# Patient Record
Sex: Female | Born: 2000 | Race: White | Hispanic: No | Marital: Single | Smoking: Never smoker
Health system: Southern US, Community
[De-identification: ages and names within clinical notes are randomized; demographics above are authoritative.]

## PROBLEM LIST (undated history)

## (undated) DIAGNOSIS — F32A Depression, unspecified: Secondary | ICD-10-CM

## (undated) DIAGNOSIS — G43909 Migraine, unspecified, not intractable, without status migrainosus: Secondary | ICD-10-CM

## (undated) HISTORY — PX: HERNIA REPAIR: SHX51

## (undated) HISTORY — DX: Migraine, unspecified, not intractable, without status migrainosus: G43.909

## (undated) HISTORY — DX: Depression, unspecified: F32.A

## (undated) HISTORY — PX: TOOTH EXTRACTION: SUR596

---
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
## 2020-04-30 ENCOUNTER — Emergency Department (HOSPITAL_COMMUNITY)
Admission: EM | Admit: 2020-04-30 | Discharge: 2020-04-30 | Disposition: A | Discharge status: Home / Self Care | Payer: Medicaid Other | Attending: Emergency Medicine | Admitting: Emergency Medicine

## 2020-04-30 ENCOUNTER — Encounter (HOSPITAL_COMMUNITY): Payer: Self-pay | Admitting: *Deleted

## 2020-04-30 ENCOUNTER — Other Ambulatory Visit: Payer: Self-pay

## 2020-04-30 DIAGNOSIS — M545 Low back pain, unspecified: Secondary | ICD-10-CM | POA: Diagnosis not present

## 2020-04-30 LAB — URINALYSIS, ROUTINE W REFLEX MICROSCOPIC
Bilirubin Urine: NEGATIVE
Glucose, UA: NEGATIVE mg/dL
Hgb urine dipstick: NEGATIVE
Ketones, ur: NEGATIVE mg/dL
Leukocytes,Ua: NEGATIVE
Nitrite: NEGATIVE
Protein, ur: NEGATIVE mg/dL
Specific Gravity, Urine: 1.024 (ref 1.005–1.030)
pH: 7 (ref 5.0–8.0)

## 2020-04-30 LAB — PREGNANCY, URINE: Preg Test, Ur: NEGATIVE

## 2020-04-30 MED ORDER — METHOCARBAMOL 500 MG PO TABS
500.0000 mg | ORAL_TABLET | Freq: Once | ORAL | Status: AC
  Administered 2020-04-30: 500 mg via ORAL
  Filled 2020-04-30: qty 1

## 2020-04-30 MED ORDER — LIDOCAINE 5 % EX PTCH
1.0000 | MEDICATED_PATCH | Freq: Once | CUTANEOUS | Status: DC
  Administered 2020-04-30: 1 via TRANSDERMAL
  Filled 2020-04-30: qty 1

## 2020-04-30 MED ORDER — KETOROLAC TROMETHAMINE 30 MG/ML IJ SOLN
30.0000 mg | Freq: Once | INTRAMUSCULAR | Status: AC
  Administered 2020-04-30: 30 mg via INTRAMUSCULAR
  Filled 2020-04-30: qty 1

## 2020-04-30 NOTE — ED Triage Notes (Signed)
States she has had problems getting her urine out for the past 6 months.

## 2020-04-30 NOTE — Discharge Instructions (Signed)
You were seen here today for Back Pain: Low back pain is discomfort in the lower back that may be due to injuries to muscles and ligaments around the spine. Occasionally, it may be caused by a problem to a part of the spine called a disc. Your back pain should be treated with medicines listed below as well as back exercises and this back pain should get better over the next 2 weeks. Most patients get completely well in 4 weeks. It is important to know however, if you develop severe or worsening pain, low back pain with fever, numbness, weakness or inability to walk or urinate, you should return to the ER immediately.  Please follow up with your doctor this week for a recheck if still having symptoms.  HOME INSTRUCTIONS Self - care:  The application of heat can help soothe the pain.  Maintaining your daily activities, including walking (this is encouraged), as it will help you get better faster than just staying in bed. Do not life, push, pull anything more than 10 pounds for the next week. I am attaching back exercises that you can do at home to help facilitate your recovery.   Back Exercises - I have attached a handout on back exercises that can be done at home to help facilitate your recovery.   Medications are also useful to help with pain control.   Acetaminophen.  This medication is generally safe, and found over the counter. Take as directed for your age. You should not take more than 8 of the extra strength (500mg ) pills a day (max dose is 4000mg  total OVER one day)  Non steroidal anti inflammatory: This includes medications including Ibuprofen, naproxen and Mobic; These medications help both pain and swelling and are very useful in treating back pain.  They should be taken with food, as they can cause stomach upset, and more seriously, stomach bleeding. Do not combine the medications.   Lidocaine Patch: Salon Pas lidocaine patches (blue and silver box) can be purchased over the counter and worn  for 12 hours for local pain relief   Muscle relaxants:  These medications can help with muscle tightness that is a cause of lower back pain.  Most of these medications can cause drowsiness, and it is not safe to drive or use dangerous machinery while taking them. They are primarily helpful when taken at night before sleep.  You will need to follow up with your primary healthcare provider or the Orthopedist in 1-2 weeks for reassessment and persistent symptoms.  Be aware that if you develop new symptoms, such as a fever, leg weakness, difficulty with or loss of control of your urine or bowels, abdominal pain, or more severe pain, you will need to seek medical attention and/or return to the Emergency department. Additional Information:  Your vital signs today were: BP 121/71   Pulse 92   Temp 97.9 F (36.6 C) (Oral)   Resp 20   Ht 5\' 2"  (1.575 m)   Wt 96.9 kg   LMP 04/16/2020   SpO2 99%   BMI 39.06 kg/m  If your blood pressure (BP) was elevated above 135/85 this visit, please have this repeated by your doctor within one month. ---------------

## 2020-04-30 NOTE — ED Triage Notes (Signed)
States she has had sever back pain for the past 5 days, states she is unable to bend over

## 2020-04-30 NOTE — ED Provider Notes (Signed)
[facility] EMERGENCY DEPARTMENT Provider Note   CSN: [id number] Arrival date & time: 04/30/20  1803     History Chief Complaint  Patient presents with  . Back Pain    [name] is a 20 y.o. female.  [name] is a 20 y.o. female repair, who is healthy, who presents to the ED for evaluation of low back pain.  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  Pain was initially more mild but has worsened over the past few days is present across her low back and is not worse on one side.  Pain does not radiate into the legs.  No associated numbness, weakness or tingling.  No saddle anesthesia no loss of bowel or bladder control.  No associated abdominal pain.  Patient denies any dysuria or urinary frequency, does report that over the past 6 months intermittently she has felt like she has had to urinate and then it stops but she feels like she is not emptying her bladder she denies any new changes with the symptoms and has no difficulty she needs to urinate.  No constipation.  She has not followed up with anyone regarding these urinary symptoms.  Went to urgent care initially today for the symptoms, they felt that it was likely due to UTI, told her her urine looked normal but prescribed her an antibiotic, but patient was worried it could be something different given that her urinary symptoms have not really changed at all but she is having low back pain, no flank pain or fevers.  No nausea or vomiting.  Reports pain in her back seems to be worse in particular with bending or twisting.  She tried a one-time dose of ibuprofen without much improvement has not tried anything different.  Was prescribed ibuprofen and Zanaflex at urgent care today but has not started these medications yet.        History reviewed. No pertinent past  medical history.  There are no problems to display for this patient.   Past Surgical History:  Procedure Laterality Date  . HERNIA REPAIR       OB History   No obstetric history on file.     No family history on file.  Social History   Tobacco Use  . Smoking status: Never Smoker  . Smokeless tobacco: Never Used  Vaping Use  . Vaping Use: Never used  Substance Use Topics  . Alcohol use: Never  . Drug use: Never    Home Medications Prior to Admission medications   Not on File    Allergies    Penicillins  Review of Systems   Review of Systems  Constitutional: Negative for chills and fever.  HENT: Negative.   Respiratory: Negative for shortness of breath.   Cardiovascular: Negative for chest pain.  Gastrointestinal: Negative for abdominal pain, constipation, diarrhea, nausea and vomiting.  Genitourinary: Negative for dysuria, flank pain, frequency and hematuria.  Musculoskeletal: Positive for back pain. Negative for arthralgias, gait problem, joint swelling, myalgias and neck pain.  Skin: Negative for color change, rash and wound.  Neurological: Negative for weakness and numbness.    Physical Exam Updated Vital Signs BP 121/71   Pulse 92   Temp 97.9 F (36.6 C) (Oral)   Resp 20   Ht 5\' 2"  (  1.575 m)   Wt 96.9 kg   LMP 04/16/2020   SpO2 99%   BMI 39.06 kg/m   Physical Exam Vitals and nursing note reviewed.  Constitutional:      General: She is not in acute distress.    Appearance: Normal appearance. She is well-developed. She is obese. She is not ill-appearing or diaphoretic.     Comments: Well-appearing and in no distress  HENT:     Head: Atraumatic.  Eyes:     General:        Right eye: No discharge.        Left eye: No discharge.  Cardiovascular:     Pulses:          Radial pulses are 2+ on the right side and 2+ on the left side.       Dorsalis pedis pulses are 2+ on the right side and 2+ on the left side.       Posterior tibial pulses are  2+ on the right side and 2+ on the left side.  Pulmonary:     Effort: Pulmonary effort is normal. No respiratory distress.  Abdominal:     General: Bowel sounds are normal. There is no distension.     Palpations: Abdomen is soft. There is no mass.     Tenderness: There is no abdominal tenderness. There is no guarding.     Comments: Abdomen soft, nondistended, nontender to palpation in all quadrants without guarding or peritoneal signs, no CVA tenderness bilaterally  Musculoskeletal:        General: Tenderness present.     Cervical back: Neck supple.     Comments: Tenderness to palpation across the low back, no focal midline spinal tenderness, no overlying skin changes.  Pain made worse with range of motion of the lower extremities,  Negative straight meg raise bilaterally. Pain worse with bending and twisting  Skin:    General: Skin is warm and dry.     Capillary Refill: Capillary refill takes less than 2 seconds.  Neurological:     Mental Status: She is alert and oriented to person, place, and time.     Comments: Alert, clear speech, following commands. Moving all extremities without difficulty. Bilateral lower extremities with 5/5 strength in proximal and distal muscle groups and with dorsi and plantar flexion. Sensation intact in bilateral lower extremities. 2+ patellar DTRs bilaterally. Ambulatory with steady gait  Psychiatric:        Mood and Affect: Mood normal.        Behavior: Behavior normal.     ED Results / Procedures / Treatments   Labs (all labs ordered are listed, but only abnormal results are displayed) Labs Reviewed  URINALYSIS, ROUTINE W REFLEX MICROSCOPIC - Abnormal; Notable for the following components:      Result Value   APPearance HAZY (*)    All other components within normal limits  PREGNANCY, URINE    EKG None  Radiology No results found.  Procedures Procedures   Medications Ordered in ED Medications  lidocaine (LIDODERM) 5 % 1 patch (1  patch Transdermal Patch Applied 04/30/20 1922)  ketorolac (TORADOL) 30 MG/ML injection 30 mg (30 mg Intramuscular Given 04/30/20 1922)  methocarbamol (ROBAXIN) tablet 500 mg (500 mg Oral Given 04/30/20 1922)    ED Course  I have reviewed the triage vital signs and the nursing notes.  Pertinent labs & imaging results that were available during my care of the patient were reviewed by me and considered in  my medical decision making (see chart for details).    MDM Rules/Calculators/A&P                         Patient presents with complaint of back pain.  Patient is nontoxic appearing, vitals are WNL. Patient has normal neurologic exam, no point/focal midline tenderness to palpation. Ambulatory in the ED.  No back pain red flags. No urinary sxs. Most likely muscle strain versus spasm. Considered UTI/pyelonephritis, kidney stone, aortic aneurysm/dissection, cauda equina or epidural abscess however these do not feel these diagnoses fit clinical picture at this time.  UA in the ED without signs of infection, negative pregnancy.  Patient prescribed ibuprofen and Zanaflex at urgent care today encouraged her to use these as well as over-the-counter Salonpas lidocaine patches to help treat pain also provided back exercises.  I discussed treatment plan, need for PCP/Ortho follow-up, and return precautions with the patient. Provided opportunity for questions, patient confirmed understanding and is in agreement with plan.      Final Clinical Impression(s) / ED Diagnoses Final diagnoses:  Acute bilateral low back pain without sciatica    Rx / DC Orders ED Discharge Orders    None       Legrand Rams 04/30/20 2018    Maia Plan, MD 04/30/20 2314

## 2020-05-09 ENCOUNTER — Encounter: Payer: Self-pay | Admitting: Orthopaedic Surgery

## 2020-05-09 ENCOUNTER — Ambulatory Visit: Payer: Medicaid Other

## 2020-05-09 ENCOUNTER — Other Ambulatory Visit: Payer: Self-pay

## 2020-05-09 ENCOUNTER — Ambulatory Visit: Payer: Medicaid Other | Admitting: Orthopaedic Surgery

## 2020-05-09 VITALS — BP 130/77 | HR 80 | Wt 212.0 lb

## 2020-05-09 DIAGNOSIS — G8929 Other chronic pain: Secondary | ICD-10-CM

## 2020-05-09 DIAGNOSIS — M545 Low back pain, unspecified: Secondary | ICD-10-CM | POA: Diagnosis not present

## 2020-05-09 MED ORDER — NAPROXEN 500 MG PO TABS: 500.0000 mg | ORAL_TABLET | Freq: Two times a day (BID) | ORAL | 5 refills | Status: DC

## 2020-05-09 NOTE — Progress Notes (Signed)
Subjective:    Patient ID: [name], female    DOB: [birth year], 20 y.o.   MRN: [id number]  HPI She has had lower back pain on and off for over two years. She works as a Child psychotherapist.  She has no trauma.  Over the last several weeks she has had increasing pain of the lower back midline with no radiation that will not go away.  She has been evaluated for UTI at Urgent Care 04-30-20 and later that same day in the ER for lower back pain.  She has been given Lodine.  She has taken Tylenol, Advil.  She has used heat, ice.  All do not help her.  She is a little better but still has a nagging pain in the lower back.  She has no numbness, no sciatica, no bowel or bladder problem.  Urine was negative in the ER. She has been on Zanaflex also.  I have reviewed the prior notes.   Review of Systems  Constitutional: Positive for activity change.  Musculoskeletal: Positive for arthralgias and back pain.  All other systems reviewed and are negative.  For Review of Systems, all other systems reviewed and are negative.  The following is a summary of the past history medically, past history surgically, known current medicines, social history and family history.  This information is gathered electronically by the computer from prior information and documentation.  I review this each visit and have found including this information at this point in the chart is beneficial and informative.   No past medical history on file.  Past Surgical History:  Procedure Laterality Date  . HERNIA REPAIR      Current Outpatient Medications on File Prior to Visit  Medication Sig Dispense Refill  . Desvenlafaxine Succinate ER 25 MG TB24 Take by mouth.    Marland Kitchen tiZANidine (ZANAFLEX) 4 MG tablet Take by mouth.    Marland Kitchen ibuprofen (ADVIL) 800 MG tablet Take 800 mg by mouth 3 (three) times daily.     No current facility-administered medications on file prior to visit.    Social History   Socioeconomic History  . Marital  status: Single    Spouse name: Not on file  . Number of children: Not on file  . Years of education: Not on file  . Highest education level: Not on file  Occupational History  . Not on file  Tobacco Use  . Smoking status: Never Smoker  . Smokeless tobacco: Never Used  Vaping Use  . Vaping Use: Never used  Substance and Sexual Activity  . Alcohol use: Never  . Drug use: Never  . Sexual activity: Not on file  Other Topics Concern  . Not on file  Social History Narrative  . Not on file   Social Determinants of Health   Financial Resource Strain: Not on file  Food Insecurity: Not on file  Transportation Needs: Not on file  Physical Activity: Not on file  Stress: Not on file  Social Connections: Not on file  Intimate Partner Violence: Not on file    No family history on file.  BP 130/77   Pulse 80   Wt 212 lb (96.2 kg)   LMP 04/16/2020   BMI 38.78 kg/m   Body mass index is 38.78 kg/m.      Objective:   Physical Exam Vitals and nursing note reviewed. Exam conducted with a chaperone present.  Constitutional:      Appearance: She is well-developed.  HENT:  Head: Normocephalic and atraumatic.  Eyes:     Conjunctiva/sclera: Conjunctivae normal.     Pupils: Pupils are equal, round, and reactive to light.  Cardiovascular:     Rate and Rhythm: Normal rate and regular rhythm.  Pulmonary:     Effort: Pulmonary effort is normal.  Abdominal:     Palpations: Abdomen is soft.  Musculoskeletal:       Arms:     Cervical back: Normal range of motion and neck supple.  Skin:    General: Skin is warm and dry.  Neurological:     Mental Status: She is alert and oriented to person, place, and time.     Cranial Nerves: No cranial nerve deficit.     Motor: No abnormal muscle tone.     Coordination: Coordination normal.     Deep Tendon Reflexes: Reflexes are normal and symmetric. Reflexes normal.  Psychiatric:        Behavior: Behavior normal.        Thought Content:  Thought content normal.        Judgment: Judgment normal.    X-rays were done of the lumbar spine, reported separately.       Assessment & Plan:   Encounter Diagnosis  Name Primary?  . Chronic midline low back pain without sciatica Yes   I have explained the X-rays.  I will begin Naprosyn 500 po bid pc.  Exercise program given.  Return in two weeks.  Stop the ibuprofen.  She may need MRI.  The patient was given lower back exercises to do.  I have modified the exercises to customize to the patient's condition of lower back pain.  The patient expressed an understanding of the exercises.  The patient will try to do these at home as directed.  If the exercises cannot be done secondary to pain or cannot be fully done as given, then I am to be contacted.  The patient expressed understanding.  Electronically Signed Darreld Mclean, MD 3/24/20228:59 AM

## 2020-05-09 NOTE — Patient Instructions (Signed)

## 2020-10-31 ENCOUNTER — Encounter (INDEPENDENT_AMBULATORY_CARE_PROVIDER_SITE_OTHER): Payer: Self-pay | Admitting: *Deleted

## 2021-02-03 ENCOUNTER — Encounter (INDEPENDENT_AMBULATORY_CARE_PROVIDER_SITE_OTHER): Payer: Self-pay | Admitting: *Deleted

## 2021-03-13 ENCOUNTER — Other Ambulatory Visit: Payer: Self-pay

## 2021-03-13 ENCOUNTER — Encounter (INDEPENDENT_AMBULATORY_CARE_PROVIDER_SITE_OTHER): Payer: Self-pay | Admitting: Gastroenterology

## 2021-03-13 ENCOUNTER — Ambulatory Visit (INDEPENDENT_AMBULATORY_CARE_PROVIDER_SITE_OTHER): Payer: Medicaid Other | Admitting: Gastroenterology

## 2021-03-13 VITALS — BP 121/81 | HR 106 | Temp 97.4°F | Ht 62.0 in | Wt 228.7 lb

## 2021-03-13 DIAGNOSIS — R197 Diarrhea, unspecified: Secondary | ICD-10-CM | POA: Diagnosis not present

## 2021-03-13 DIAGNOSIS — R112 Nausea with vomiting, unspecified: Secondary | ICD-10-CM

## 2021-03-13 DIAGNOSIS — R140 Abdominal distension (gaseous): Secondary | ICD-10-CM | POA: Diagnosis not present

## 2021-03-13 DIAGNOSIS — R1033 Periumbilical pain: Secondary | ICD-10-CM | POA: Insufficient documentation

## 2021-03-13 DIAGNOSIS — K219 Gastro-esophageal reflux disease without esophagitis: Secondary | ICD-10-CM

## 2021-03-13 MED ORDER — OMEPRAZOLE 20 MG PO CPDR: 20.0000 mg | DELAYED_RELEASE_CAPSULE | Freq: Every day | ORAL | 3 refills | Status: AC

## 2021-03-13 MED ORDER — DICYCLOMINE HCL 10 MG PO CAPS: 10.0000 mg | ORAL_CAPSULE | Freq: Three times a day (TID) | ORAL | 2 refills | Status: AC

## 2021-03-13 NOTE — Patient Instructions (Addendum)
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 I am sending you bentyl 10mg  to be taken before meals and at bedtime, this will help with your abdominal discomfort and can also help to slow down frequency of BMs as well, occasionally this medication can cause some constipation, if you are noticing that, you can cut out the bedtime dose. I am also starting you on omeprazole 20mg  to be taken in the morning 30-45 minutes prior to eating. Please be mindful of eating late and stay upright 2-3 hours after eating, prior to lying down. Avoid trigger foods such as greasy, spicy, fried, tomato based foods, caffeine and chocolate can also contribute to acid reflux I have provided the low FODMAP diet which can be beneficial for gaining better control over IBS symptoms

## 2021-03-13 NOTE — Progress Notes (Signed)
Referring Provider: Suzan Slick, MD Primary Care Physician:  Suzan Slick, MD Primary GI Physician: new  Chief Complaint  Patient presents with   Abdominal Pain    Patient arrives as a new patient for abdominal cramping. States back in September she had abdominal and diarrhea that lasted 2 -3 weeks. Now has it off and on. Has loose stools. Has a stabbing pain in mid abdomen. Vomiting off and on. Has gas and bloating. Tried pepto and it helped a little bit. Has dark stools at times.    HPI:   [name] is a 21 y.o. female with past medical history of depression and migraines  Patient presenting today as new patient for abdominal cramping, diarrhea and dark stools.   She states that in September she had a 2-3 week stent of diarrhea and abdominal pain with multiple episodes of watery diarrhea per day. She denies any rectal bleeding, melena, fevers or chills at that time. She did not have any stool testing or other evaluation at that time, she took some pepto bismol for symptomatic relief and symptoms eventually resolved. She reports that even a few years prior to acute diarrhea she had some mild intermittent abdominal pain with intermittent looser stools and occasional constipation as well as bloating, which she continues to experience. She has a BM atleast once per day now with mix of sometimes formed and sometimes loose stools. Abdominal pain is in umbilical area and improves with BM. Previously during acute stent of diarrhea she felt that pain was more stabbing, now it is more of a bloated and crampy feeling. She does endorse more flatulence at times. She denies any rectal bleeding or melena, though has darker brown stools. Appetite is good, she has no recent unintentional weight loss. She does notice worsening abdominal pain and looser stools after drinking milk, is unaware of worsening symptoms with grains./breads/pastas.   She also reports that she has almost daily  nausea/vomiting, this has been ongoing for the past 4 years. She reports that symptoms dont seem to be precipitated by anything, sometimes it occurs upon waking. Previously thought it was related to vaping but she has since quit doing that. She does endorse phlegm feeling in her throat and occasional acid regurgitation. She does not have heartburn but sometimes has pressure in her epigastric/chest area. She states that sometimes she will vomit only once, sometimes it can happen 2-3x/day. She does admit to eating meals late in the evenings sometimes but cannot pinpoint if symptoms are worse then or not.  NSAID use: uses ibuprofen maybe twice a week Social hx: no etoh or tobacco Fam hx: maternal grandmother had stomach cancer maybe 45-50?  Last Colonoscopy:never Last Endoscopy:never  Past Medical History:  Diagnosis Date   Depression    Migraine     Past Surgical History:  Procedure Laterality Date   HERNIA REPAIR     age 44   TOOTH EXTRACTION     4 teeth extracted in 2020    Current Outpatient Medications  Medication Sig Dispense Refill   Erenumab-aooe (AIMOVIG [place]) Inject into the skin. Takes once a month injection for migraines.     No current facility-administered medications for this visit.    Allergies as of 03/13/2021 - Review Complete 03/13/2021  Allergen Reaction Noted   Penicillins Rash 10/07/2017    Family History  Problem Relation Age of Onset   Diabetes Maternal Grandmother    Diabetes Maternal Grandfather    Heart disease Maternal Grandfather  Social History   Socioeconomic History   Marital status: Single    Spouse name: Not on file   Number of children: Not on file   Years of education: Not on file   Highest education level: Not on file  Occupational History   Not on file  Tobacco Use   Smoking status: Never   Smokeless tobacco: Never  Vaping Use   Vaping Use: Never used  Substance and Sexual Activity   Alcohol use: Never   Drug use: Never    Sexual activity: Not on file  Other Topics Concern   Not on file  Social History Narrative   Not on file   Social Determinants of Health   Financial Resource Strain: Not on file  Food Insecurity: Not on file  Transportation Needs: Not on file  Physical Activity: Not on file  Stress: Not on file  Social Connections: Not on file   Review of systems General: negative for malaise, night sweats, fever, chills, weight loss Neck: Negative for lumps, goiter, pain and significant neck swelling Resp: Negative for cough, wheezing, dyspnea at rest CV: Negative for chest pain, leg swelling, palpitations, orthopnea GI: denies melena, hematochezia, dysphagia, odyonophagia, early satiety or unintentional weight loss. +nausea +vomiting +diarrhea +abdominal bloating +cramping MSK: Negative for joint pain or swelling, back pain, and muscle pain. Derm: Negative for itching or rash Psych: Denies depression, anxiety, memory loss, confusion. No homicidal or suicidal ideation.  Heme: Negative for prolonged bleeding, bruising easily, and swollen nodes. Endocrine: Negative for cold or heat intolerance, polyuria, polydipsia and goiter. Neuro: negative for tremor, gait imbalance, syncope and seizures. The remainder of the review of systems is noncontributory.  Physical Exam: BP 121/81 (BP Location: Left Arm, Patient Position: Sitting, Cuff Size: Large)    Pulse (!) 106    Temp (!) 97.4 F (36.3 C) (Oral)    Ht 5\' 2"  (1.575 m)    Wt 228 lb 11.2 oz (103.7 kg)    BMI 41.83 kg/m  General:   Alert and oriented. No distress noted. Pleasant and cooperative.  Head:  Normocephalic and atraumatic. Eyes:  Conjuctiva clear without scleral icterus. Mouth:  Oral mucosa pink and moist. Good dentition. No lesions. Heart: Normal rate and rhythm, s1 and s2 heart sounds present.  Lungs: Clear lung sounds in all lobes. Respirations equal and unlabored. Abdomen:  +BS, soft, and non-distended. Mild TTP of diffuse lower  abdomen. No rebound or guarding. No HSM or masses noted. Derm: No palmar erythema or jaundice Msk:  Symmetrical without gross deformities. Normal posture. Extremities:  Without edema. Neurologic:  Alert and  oriented x4 Psych:  Alert and cooperative. Normal mood and affect.  Invalid input(s): 6 MONTHS   ASSESSMENT: [name] is a 21 y.o. female presenting today as a new patient for ongoing abdominal cramping/bloating and mix of solid and looser stools x4 years.  Acute course of diarrhea likely related to viral gastroenteritis as this seemed to resolve on its own. Patient reports long history of mix of solid stools, looser stools and frequent abdominal bloating and cramping, these are likely related to IBS, however, she does note worsening symptoms with milk. We will proceed with celiac testing to rule out gluten allergy, however, we discussed implementing low FODMAP diet to see if this helps improve symptoms. I am also sending bentyl 10mg  to be taken QID.  Given her chronic nausea with vomiting, as well as acid regurgitation and pressure in epigastric chest area. Symptoms likely related to GERD.  We will try implementing omeprazole 20mg  once daily as well as reflux precautions to see if this helps her symptoms. Patient also counseled on long term side effects of NSAIDs on the GI tract and encouraged to use tylenol instead.  Reassuringly, patient presents with No red flag symptoms. She denies melena, hematochezia,  dysphagia, odyonophagia, early satiety or weight loss.    PLAN:  Celiac testing  2. Bentyl 10mg  TID and at bedtime 3. Omeprazole 20mg  once daily, take 30-45 minutes prior to breakfast 4. Low FODMAP diet 5. Reflux precautions, stay upright 2-3 hours after eating, prior to lying down, avoid greasy, spicy, tomato based foods, as well as caffeine and chocolate. 6. Avoid NSAIDs  Follow Up: 3 months  Shakeema Lippman L. , MSN, APRN, AGNP-C Adult-Gerontology Nurse  Practitioner [facility]

## 2021-03-20 ENCOUNTER — Other Ambulatory Visit: Payer: Self-pay

## 2021-03-20 ENCOUNTER — Encounter: Payer: Self-pay | Admitting: Orthopaedic Surgery

## 2021-03-20 ENCOUNTER — Ambulatory Visit (INDEPENDENT_AMBULATORY_CARE_PROVIDER_SITE_OTHER): Payer: Medicaid Other | Admitting: Orthopaedic Surgery

## 2021-03-20 VITALS — BP 153/65 | HR 92 | Ht 62.0 in | Wt 228.0 lb

## 2021-03-20 DIAGNOSIS — G8929 Other chronic pain: Secondary | ICD-10-CM | POA: Diagnosis not present

## 2021-03-20 DIAGNOSIS — M545 Low back pain, unspecified: Secondary | ICD-10-CM

## 2021-03-20 MED ORDER — NAPROXEN 500 MG PO TABS: 500.0000 mg | ORAL_TABLET | Freq: Two times a day (BID) | ORAL | 5 refills | Status: AC

## 2021-03-20 NOTE — Progress Notes (Signed)
My back is hurting again.  She has recurrence of lower back pain with no radiation.  I had seen her in March 2022 for this.  She got better but over the last two months her pain has returned and she is not improving.  She has no new trauma.  She is taking an occasional Advil with little help.  She has no weakness.  Spine/Pelvis examination:  Inspection:  Overall, sacoiliac joint benign and hips nontender; without crepitus or defects.   Thoracic spine inspection: Alignment normal without kyphosis present   Lumbar spine inspection:  Alignment  with normal lumbar lordosis, without scoliosis apparent.   Thoracic spine palpation:  without tenderness of spinal processes   Lumbar spine palpation: without tenderness of lumbar area; without tightness of lumbar muscles    Range of Motion:   Lumbar flexion, forward flexion is normal without pain or tenderness    Lumbar extension is full without pain or tenderness   Left lateral bend is normal without pain or tenderness   Right lateral bend is normal without pain or tenderness   Straight leg raising is normal  Strength & tone: normal   Stability overall normal stability  Encounter Diagnosis  Name Primary?   Chronic midline low back pain without sciatica Yes   I will set up PT.  I will give Naprosyn 500 po bid pc  Return in three weeks.  She may need MRI.  Call if any problem.  Precautions discussed.  Electronically Signed Darreld Mclean, MD 2/2/20239:07 AM

## 2021-04-02 ENCOUNTER — Other Ambulatory Visit: Payer: Self-pay

## 2021-04-02 ENCOUNTER — Ambulatory Visit: Payer: Medicaid Other | Attending: Orthopaedic Surgery

## 2021-04-02 DIAGNOSIS — M545 Low back pain, unspecified: Secondary | ICD-10-CM | POA: Diagnosis not present

## 2021-04-02 DIAGNOSIS — G8929 Other chronic pain: Secondary | ICD-10-CM | POA: Diagnosis present

## 2021-04-02 NOTE — Therapy (Signed)
[facility] [address] [place], [place], [address] Phone: [phone]   Fax:  [phone]  Physical Therapy Evaluation  Patient Details  Name: [name] MRN: [id number] Date of Birth: [birth year] Referring Provider (PT): Hilda Lias, MD   Encounter Date: 04/02/2021   PT End of Session - 04/02/21 1032     Visit Number 1    Number of Visits 12    Date for PT Re-Evaluation 06/13/21    PT Start Time 1033    PT Stop Time 1113    PT Time Calculation (min) 40 min    Activity Tolerance Patient tolerated treatment well    Behavior During Therapy [facility] for tasks assessed/performed             Past Medical History:  Diagnosis Date   Depression    Migraine     Past Surgical History:  Procedure Laterality Date   HERNIA REPAIR     age 55   TOOTH EXTRACTION     4 teeth extracted in 2020    There were no vitals filed for this visit.    Subjective Assessment - 04/02/21 1033     Subjective Patient reports that she has had back pain off and on for about 3 years now. However, it has gotten worse in the back year and a half. She notes that she slept on a couch about 6-8 weeks ago which caused further aggravation to her familiar back pain. She reports no radiating pain. She feels that her pain is worse in the morning, but it "comes and goes." She notes that when her back gets aggravated that her pain will last all day.    Limitations Sitting    How long can you sit comfortably? 5-10 minutes    Patient Stated Goals be able to sit for an hour for class, reduced pain, lift boxes for work (40 pounds)    Currently in Pain? Yes    Pain Score 8     Pain Location Back    Pain Orientation Lower    Pain Descriptors / Indicators Sharp;Stabbing    Pain Type Chronic pain    Pain Onset More than a month ago    Pain Frequency Intermittent    Aggravating Factors  waking up, sitting    Pain Relieving Factors medication    Effect of Pain on Daily  Activities can make it hard to focus in school                [facility] PT Assessment - 04/02/21 0001       Assessment   Medical Diagnosis Chronic midline low back pain without sciatica    Referring Provider (PT) Hilda Lias, MD    Onset Date/Surgical Date --   1.5 years ago   Next MD Visit 04/10/21    Prior Therapy No      Precautions   Precautions None      Restrictions   Weight Bearing Restrictions No      Balance Screen   Has the patient fallen in the past 6 months No    Has the patient had a decrease in activity level because of a fear of falling?  No    Is the patient reluctant to leave their home because of a fear of falling?  No      Home [employer]ist information centre manager residence      Prior Function   Level of Independence Independent    Vocation Student;Part time  employment    Vocation Requirements sit for up to 1 hour, lift up to 40 pounds    Leisure hiking      Cognition   Overall Cognitive Status Within Functional Limits for tasks assessed    Memory Appears intact    Awareness Appears intact    Problem Solving Appears intact      Sensation   Additional Comments Patient reports no numbness or tingling      Functional Tests   Functional tests Squat      Squat   Comments increased lumbar flexion   pain with lifting 5 pounds     Posture/Postural Control   Posture/Postural Control Postural limitations    Postural Limitations Rounded Shoulders;Decreased lumbar lordosis      ROM / Strength   AROM / PROM / Strength AROM;Strength      AROM   AROM Assessment Site Lumbar    Lumbar Flexion 64   throbbing   Lumbar Extension 35    Lumbar - Right Side Bend WFL   tight   Lumbar - Left Side Bend WFL   tight   Lumbar - Right Rotation WFL   tight   Lumbar - Left Rotation WFL   tight     Strength   Strength Assessment Site Hip;Knee;Ankle    Right/Left Hip Right;Left    Right Hip Flexion 4/5   "strenuous"   Left Hip Flexion 4/5   "strenuous"    Right/Left Knee Right;Left    Right Knee Flexion 4+/5   "strenuous"   Right Knee Extension 4+/5   crepitus   Left Knee Flexion 4/5   "strenuous"   Left Knee Extension 4+/5    Right/Left Ankle Right;Left    Right Ankle Dorsiflexion 4+/5    Left Ankle Dorsiflexion 4+/5      Palpation   Spinal mobility Lumbar: WFL and reproduced familiar low back pain    Palpation comment TTP: bilateral thoracic and lumbar paraspinals (pain increases at lower lumbar levels: L3-5), QL, obliques, right TFL, hamstrings, and IT band   lumbar paraspinals were the most painful     Special Tests    Special Tests Lumbar;Sacrolliac Tests    Lumbar Tests Straight Leg Raise    Sacroiliac Tests  Sacral Compression      Straight Leg Raise   Findings Negative    Side  --   Both   Comment Pain was not reproduced until hip flexion was greater than 60 degrees      Sacral Compression   Findings Negative      Ambulation/Gait   Ambulation/Gait Yes    Ambulation/Gait Assistance 7: Independent    Assistive device None    Gait Pattern Within Functional Limits    Ambulation Surface Level;Indoor    Gait velocity [facility]      Balance   Balance Assessed Yes      Static Standing Balance   Static Standing - Balance Support No upper extremity supported    Static Standing Balance -  Activities  Single Leg Stance - Right Leg;Single Leg Stance - Left Leg    Static Standing - Comment/# of Minutes 30 seconds each                        Objective measurements completed on examination: See above findings.                     PT Long Term Goals - 04/02/21 1253  PT LONG TERM GOAL #1   Title Patient will be independent with her HEP.    Time 6    Period Weeks    Status New    Target Date 05/14/21      PT LONG TERM GOAL #2   Title Patient will report being able to sit for at least 1 hour without being limited by her familiar low back pain to complete her school work without being limited  by her familiar pain.    Time 6    Period Weeks    Status New    Target Date 05/14/21      PT LONG TERM GOAL #3   Title Patient will be able lift at least 15 pounds without being limited by her low back pain for improved function with her critical job demands.    Time 6    Period Weeks    Status New    Target Date 05/14/21                    Plan - 04/02/21 1243     Clinical Impression Statement Patient is a 21 year old female presenting with chronic low back pain with no known cause. She experienced a recent flare up approximately 6-8 weeks ago. She exhibited bilateral lower extremity weakness, postural deviations, and increase tenderness to palpation. Her familiar pain was able to be reproduced with lumbar joint mobility testing of her lumbar spine. She also demonstrated increased lumbar flexion and pain with squatting and lift five pounds. Recommend that she continue physical therapy to address her remaining impairments to return to her prior level of function and complete her school work without being limited by her low back.    Personal Factors and Comorbidities Time since onset of injury/illness/exacerbation    Examination-Activity Limitations Bend;Sit;Squat;Lift    Examination-Participation Restrictions Occupation;School    Stability/Clinical Decision Making Evolving/Moderate complexity    Clinical Decision Making Moderate    Rehab Potential Good    PT Frequency 2x / week    PT Duration 6 weeks    PT Treatment/Interventions Neuromuscular re-education;Therapeutic exercise;Therapeutic activities;Manual techniques;Patient/family education;Dry needling;Functional mobility training    PT Next Visit Plan recumbent bike, postural re-education and core stabilization    Consulted and Agree with Plan of Care Patient             Patient will benefit from skilled therapeutic intervention in order to improve the following deficits and impairments:  Decreased activity  tolerance, Pain, Postural dysfunction, Decreased strength  Visit Diagnosis: Chronic midline low back pain without sciatica     Problem List Patient Active Problem List   Diagnosis Date Noted   Nausea and vomiting 03/13/2021   Diarrhea 03/13/2021   Periumbilical abdominal pain 03/13/2021   Abdominal bloating 03/13/2021   Gastroesophageal reflux disease 03/13/2021    Granville Lewis, PT 04/02/2021, 1:11 PM  [facility] [address] [place], [place], [address] Phone: [phone]   Fax:  [phone]  Name: [name] MRN: [id number] Date of Birth: August 18, 2000

## 2021-04-09 ENCOUNTER — Ambulatory Visit: Payer: Medicaid Other

## 2021-04-09 ENCOUNTER — Other Ambulatory Visit: Payer: Self-pay

## 2021-04-09 DIAGNOSIS — G8929 Other chronic pain: Secondary | ICD-10-CM

## 2021-04-09 DIAGNOSIS — M545 Low back pain, unspecified: Secondary | ICD-10-CM | POA: Diagnosis not present

## 2021-04-09 NOTE — Therapy (Signed)
[facility] [address] [place], [place], [address] Phone: [phone]   Fax:  [phone]  Physical Therapy Treatment  Patient Details  Name: [name] MRN: [id number] Date of Birth: [birth year] Referring Provider (PT): Hilda Lias, MD   Encounter Date: 04/09/2021   PT End of Session - 04/09/21 0947     Visit Number 2    Number of Visits 12    Date for PT Re-Evaluation 06/13/21    PT Start Time 0950    PT Stop Time 1030    PT Time Calculation (min) 40 min    Activity Tolerance Patient tolerated treatment well    Behavior During Therapy [facility] for tasks assessed/performed             Past Medical History:  Diagnosis Date   Depression    Migraine     Past Surgical History:  Procedure Laterality Date   HERNIA REPAIR     age 20   TOOTH EXTRACTION     4 teeth extracted in 2020    There were no vitals filed for this visit.   Subjective Assessment - 04/09/21 0947     Subjective Patient reports tha she is hurting a little bit today, but not too bad.    Limitations Sitting    How long can you sit comfortably? 5-10 minutes    Patient Stated Goals be able to sit for an hour for class, reduced pain, lift boxes for work (40 pounds)    Currently in Pain? Yes    Pain Score 5     Pain Location Back    Pain Onset More than a month ago                               [facility] Adult PT Treatment/Exercise - 04/09/21 0001       Exercises   Exercises Knee/Hip      Knee/Hip Exercises: Stretches   Other Knee/Hip Stretches Lower trunk rotation   1.5 minutes piror to low back discomfort     Knee/Hip Exercises: Aerobic   Recumbent Bike L5 x 11 minutes      Knee/Hip Exercises: Standing   Other Standing Knee Exercises Ball press   5 second hold; 20 reps   Other Standing Knee Exercises Self STM   with tennis ball     Knee/Hip Exercises: Supine   Bridges Both   12 reps   Straight Leg Raises Both;10 reps     Other Supine Knee/Hip Exercises Bent knee fall out   20 reps each     Manual Therapy   Manual Therapy Joint mobilization;Soft tissue mobilization    Joint Mobilization Lumbar grade I-III    Soft tissue mobilization bilateral lumbar paraspinals                          PT Long Term Goals - 04/02/21 1253       PT LONG TERM GOAL #1   Title Patient will be independent with her HEP.    Time 6    Period Weeks    Status New    Target Date 05/14/21      PT LONG TERM GOAL #2   Title Patient will report being able to sit for at least 1 hour without being limited by her familiar low back pain to complete her school work without being limited by her familiar pain.  Time 6    Period Weeks    Status New    Target Date 05/14/21      PT LONG TERM GOAL #3   Title Patient will be able lift at least 15 pounds without being limited by her low back pain for improved function with her critical job demands.    Time 6    Period Weeks    Status New    Target Date 05/14/21                   Plan - 04/09/21 0947     Clinical Impression Statement Patient was introduced to multiple new interventions for improved lumbar stability and strength. She experienced a moderate increase in low back discomfort with activities such as lower trunk rotations and straight leg raises resulting in these activities being stopped at that time. However, this was able to be reduced with manual therapy which focused on lumbar joint mobilizations and soft tissue mobilization to the lumbar paraspinals. She was educated on how she can utilize a tennis ball at home on her lumbar musculature to help further reduce her symptoms. She reported that her back felt better upon the conclusion of treatment. She continues to require skilled physical therapy to address her remaining impairments to return to her prior level of function.    Personal Factors and Comorbidities Time since onset of  injury/illness/exacerbation    Examination-Activity Limitations Bend;Sit;Squat;Lift    Examination-Participation Restrictions Occupation;School    Stability/Clinical Decision Making Evolving/Moderate complexity    Rehab Potential Good    PT Frequency 2x / week    PT Duration 6 weeks    PT Treatment/Interventions Neuromuscular re-education;Therapeutic exercise;Therapeutic activities;Manual techniques;Patient/family education;Dry needling;Functional mobility training    PT Next Visit Plan recumbent bike, postural re-education and core stabilization    Consulted and Agree with Plan of Care Patient             Patient will benefit from skilled therapeutic intervention in order to improve the following deficits and impairments:  Decreased activity tolerance, Pain, Postural dysfunction, Decreased strength  Visit Diagnosis: Chronic midline low back pain without sciatica     Problem List Patient Active Problem List   Diagnosis Date Noted   Nausea and vomiting 03/13/2021   Diarrhea 03/13/2021   Periumbilical abdominal pain 03/13/2021   Abdominal bloating 03/13/2021   Gastroesophageal reflux disease 03/13/2021    Granville Lewis, PT 04/09/2021, 11:47 AM  [facility] [address] [place], [place], [address] Phone: [phone]   Fax:  [phone]  Name: [name] MRN: [id number] Date of Birth: 04-25-2000

## 2021-04-10 ENCOUNTER — Ambulatory Visit (INDEPENDENT_AMBULATORY_CARE_PROVIDER_SITE_OTHER): Payer: Medicaid Other | Admitting: Orthopaedic Surgery

## 2021-04-10 ENCOUNTER — Encounter: Payer: Self-pay | Admitting: Orthopaedic Surgery

## 2021-04-10 VITALS — BP 120/78 | HR 88 | Ht 62.0 in | Wt 228.0 lb

## 2021-04-10 DIAGNOSIS — G8929 Other chronic pain: Secondary | ICD-10-CM | POA: Diagnosis not present

## 2021-04-10 DIAGNOSIS — M545 Low back pain, unspecified: Secondary | ICD-10-CM | POA: Diagnosis not present

## 2021-04-10 NOTE — Progress Notes (Signed)
I feel a little better.  She has been to PT for her lower back pain.  I have reviewed the notes.  She is improved. She still has the pain but less.  She is moving better.  She has no weakness, no new trauma.  I want her to continue the PT.  Spine/Pelvis examination:  Inspection:  Overall, sacoiliac joint benign and hips nontender; without crepitus or defects.   Thoracic spine inspection: Alignment normal without kyphosis present   Lumbar spine inspection:  Alignment  with normal lumbar lordosis, without scoliosis apparent.   Thoracic spine palpation:  without tenderness of spinal processes   Lumbar spine palpation: without tenderness of lumbar area; without tightness of lumbar muscles    Range of Motion:   Lumbar flexion, forward flexion is normal without pain or tenderness    Lumbar extension is full without pain or tenderness   Left lateral bend is normal without pain or tenderness   Right lateral bend is normal without pain or tenderness   Straight leg raising is normal  Strength & tone: normal   Stability overall normal stability  Encounter Diagnosis  Name Primary?   Chronic midline low back pain without sciatica Yes   Continue PT.  See how she is doing and consider MRI if not improving more.  Call if any problem.  Precautions discussed.  Electronically Signed Darreld Mclean, MD 2/23/202310:06 AM

## 2021-04-14 ENCOUNTER — Other Ambulatory Visit: Payer: Self-pay

## 2021-04-14 ENCOUNTER — Ambulatory Visit: Payer: Medicaid Other | Admitting: Physical Therapy

## 2021-04-14 ENCOUNTER — Encounter: Payer: Self-pay | Admitting: Physical Therapy

## 2021-04-14 DIAGNOSIS — M545 Low back pain, unspecified: Secondary | ICD-10-CM | POA: Diagnosis not present

## 2021-04-14 NOTE — Therapy (Addendum)
[facility] [place], [place], [address] Phone: [phone]   Fax:  [phone]  Physical Therapy Treatment  Patient Details  Name: [name] MRN: [id number] Date of Birth: 10-07-2000 Referring Provider (PT): Luna Glasgow, MD   Encounter Date: 04/14/2021   PT End of Session - 04/14/21 1110     Visit Number 3    Number of Visits 12    Date for PT Re-Evaluation 06/13/21    PT Start Time 0945    PT Stop Time 1017    PT Time Calculation (min) 32 min    Activity Tolerance Patient tolerated treatment well    Behavior During Therapy [facility] for tasks assessed/performed             Past Medical History:  Diagnosis Date   Depression    Migraine     Past Surgical History:  Procedure Laterality Date   HERNIA REPAIR     age 16   TOOTH EXTRACTION     4 teeth extracted in 2020    There were no vitals filed for this visit.   Subjective Assessment - 04/14/21 1110     Subjective Patient slipped on grease over the weekend and fell on her back.  Decided to go easier today due to this.  Most pain on right today.    Limitations Sitting    How long can you sit comfortably? 5-10 minutes    Patient Stated Goals be able to sit for an hour for class, reduced pain, lift boxes for work (40 pounds)    Currently in Pain? Yes    Pain Score 5     Pain Location Back    Pain Orientation Lower    Pain Onset More than a month ago                               [facility] Adult PT Treatment/Exercise - 04/14/21 0001       Exercises   Exercises Knee/Hip      Knee/Hip Exercises: Aerobic   Nustep Level 3 x 15 minutes.      Manual Therapy   Soft tissue mobilization In left sdly position with pillow between knees for comfort;  STW/M x 10 minutes to patient's right low back including ischemic release technique to her right QL.                          PT Long Term Goals - 04/02/21 1253       PT LONG  TERM GOAL #1   Title Patient will be independent with her HEP.    Time 6    Period Weeks    Status New    Target Date 05/14/21      PT LONG TERM GOAL #2   Title Patient will report being able to sit for at least 1 hour without being limited by her familiar low back pain to complete her school work without being limited by her familiar pain.    Time 6    Period Weeks    Status New    Target Date 05/14/21      PT LONG TERM GOAL #3   Title Patient will be able lift at least 15 pounds without being limited by her low back pain for improved function with her critical job demands.    Time 6    Period Weeks  Status New    Target Date 05/14/21                   Plan - 04/14/21 1112     Clinical Impression Statement Pateitn fell on back over weekend due to slipping on grease.  Did not progress ther ex today due to this.  She did very well wiht soft tissue work and felt better following.  She was quite tender to palpation over her right QL.  She reports increased pain while sitting.  Instructed her in the use of a lumbar roll.    Personal Factors and Comorbidities Time since onset of injury/illness/exacerbation    Examination-Activity Limitations Bend;Sit;Squat;Lift    Examination-Participation Restrictions Occupation;School    Stability/Clinical Decision Making Evolving/Moderate complexity    Rehab Potential Good    PT Frequency 2x / week    PT Duration 6 weeks    PT Treatment/Interventions Neuromuscular re-education;Therapeutic exercise;Therapeutic activities;Manual techniques;Patient/family education;Dry needling;Functional mobility training    PT Next Visit Plan recumbent bike, postural re-education and core stabilization    Consulted and Agree with Plan of Care Patient             Patient will benefit from skilled therapeutic intervention in order to improve the following deficits and impairments:  Decreased activity tolerance, Pain, Postural dysfunction, Decreased  strength  Visit Diagnosis: Chronic midline low back pain without sciatica     Problem List Patient Active Problem List   Diagnosis Date Noted   Nausea and vomiting 03/13/2021   Diarrhea 02/26/347   Periumbilical abdominal pain 03/13/2021   Abdominal bloating 03/13/2021   Gastroesophageal reflux disease 03/13/2021    Walter Min, [place], PT 04/14/2021, 11:15 AM  [facility] [address] [place], [place], [address] Phone: [phone]   Fax:  [phone]  Name: [name] MRN: [id number] Date of Birth: 07-Aug-2000  PHYSICAL THERAPY DISCHARGE SUMMARY  Visits from Start of Care: 3  Current functional level related to goals / functional outcomes: Patient is being discharged at this time as she has not returned since her last appointment. She was unable to meet her goals for physical therapy in 3 visits.    Remaining deficits: Pain   Education / Equipment: HEP   Patient agrees to discharge. Patient goals were not met. Patient is being discharged due to not returning since the last visit.  Jacqulynn Cadet, PT, DPT

## 2021-05-01 ENCOUNTER — Ambulatory Visit (INDEPENDENT_AMBULATORY_CARE_PROVIDER_SITE_OTHER): Payer: Medicaid Other | Admitting: Orthopaedic Surgery

## 2021-05-01 ENCOUNTER — Other Ambulatory Visit: Payer: Self-pay

## 2021-05-01 ENCOUNTER — Encounter: Payer: Self-pay | Admitting: Orthopaedic Surgery

## 2021-05-01 VITALS — BP 128/76 | HR 78 | Ht 62.0 in | Wt 225.0 lb

## 2021-05-01 DIAGNOSIS — G8929 Other chronic pain: Secondary | ICD-10-CM | POA: Diagnosis not present

## 2021-05-01 DIAGNOSIS — M5441 Lumbago with sciatica, right side: Secondary | ICD-10-CM

## 2021-05-01 NOTE — Progress Notes (Signed)
My back is still hurting. ? ?She has continued pain of the lower back with right sided sciatica type pain.  She has been to PT.  She has done the exercises. She has taken the Naprosyn and still is hurting. ? ?I have reviewed the PT notes. ? ?I will get a MRI of the lumbar spine. ? ?Spine/Pelvis examination: ? Inspection:  Overall, sacoiliac joint benign and hips nontender; without crepitus or defects. ? ? Thoracic spine inspection: Alignment normal without kyphosis present ? ? Lumbar spine inspection:  Alignment  with normal lumbar lordosis, without scoliosis apparent. ? ? Thoracic spine palpation:  without tenderness of spinal processes ? ? Lumbar spine palpation: without tenderness of lumbar area; without tightness of lumbar muscles  ? ? Range of Motion: ?  Lumbar flexion, forward flexion is normal without pain or tenderness  ?  Lumbar extension is full without pain or tenderness ?  Left lateral bend is normal without pain or tenderness ?  Right lateral bend is normal without pain or tenderness ?  Straight leg raising is normal ? Strength & tone: normal ? ? Stability overall normal stability ? ?Encounter Diagnosis  ?Name Primary?  ? Chronic right-sided low back pain with right-sided sciatica Yes  ? ?Get MRI. ? ?Continue PT and Naprosyn. ? ?Return in two weeks. ? ?Call if any problem. ? ?Precautions discussed. ? ?Electronically Signed ?Darreld Mclean, MD ?3/16/202310:13 AM ? ?

## 2021-05-01 NOTE — Progress Notes (Signed)
You can call Central Scheduling at [phone] to schedule your MRI.  Please make sure you have a follow up appointment with Dr Dallas Schimke after the MRI.    ?

## 2021-05-15 ENCOUNTER — Ambulatory Visit: Payer: Medicaid Other | Admitting: Orthopaedic Surgery

## 2021-05-26 ENCOUNTER — Ambulatory Visit (HOSPITAL_COMMUNITY)
Admission: RE | Admit: 2021-05-26 | Discharge: 2021-05-26 | Disposition: A | Discharge status: Home / Self Care | Payer: Medicaid Other | Source: Ambulatory Visit | Attending: Orthopaedic Surgery | Admitting: Orthopaedic Surgery

## 2021-05-26 DIAGNOSIS — G8929 Other chronic pain: Secondary | ICD-10-CM | POA: Diagnosis present

## 2021-05-26 DIAGNOSIS — M5441 Lumbago with sciatica, right side: Secondary | ICD-10-CM | POA: Diagnosis present

## 2021-05-29 ENCOUNTER — Encounter: Payer: Self-pay | Admitting: Orthopaedic Surgery

## 2021-05-29 ENCOUNTER — Ambulatory Visit (INDEPENDENT_AMBULATORY_CARE_PROVIDER_SITE_OTHER): Payer: Medicaid Other | Admitting: Orthopaedic Surgery

## 2021-05-29 DIAGNOSIS — G8929 Other chronic pain: Secondary | ICD-10-CM

## 2021-05-29 DIAGNOSIS — M5441 Lumbago with sciatica, right side: Secondary | ICD-10-CM | POA: Diagnosis not present

## 2021-05-29 NOTE — Progress Notes (Signed)
My back still hurts. ? ?She had the MRI of the lumbar spine.  It showed: ?IMPRESSION: ?1. Central to left subarticular disc protrusion at L5-S1 with ?resultant mild left greater than right lateral recess stenosis, with ?mild bilateral L5 foraminal narrowing. ?2. Small central disc protrusion at L4-5 without stenosis or ?impingement. ? ?I have explained the findings to her.  She appears to understand.  I will have neurosurgeon see as a baseline visit. ? ?I have independently reviewed the MRI.   ? ?Spine/Pelvis examination: ? Inspection:  Overall, sacoiliac joint benign and hips nontender; without crepitus or defects. ? ? Thoracic spine inspection: Alignment normal without kyphosis present ? ? Lumbar spine inspection:  Alignment  with normal lumbar lordosis, without scoliosis apparent. ? ? Thoracic spine palpation:  without tenderness of spinal processes ? ? Lumbar spine palpation: without tenderness of lumbar area; without tightness of lumbar muscles  ? ? Range of Motion: ?  Lumbar flexion, forward flexion is normal without pain or tenderness  ?  Lumbar extension is full without pain or tenderness ?  Left lateral bend is normal without pain or tenderness ?  Right lateral bend is normal without pain or tenderness ?  Straight leg raising is normal ? Strength & tone: normal ? ? Stability overall normal stability ?Encounter Diagnosis  ?Name Primary?  ? Chronic right-sided low back pain with right-sided sciatica Yes  ? ?To neurosurgery. ? ?Call if any problem. ? ?Precautions discussed. ? ?Electronically Signed ?Darreld Mclean, MD ?4/13/202310:41 AM ? ?

## 2021-05-29 NOTE — Progress Notes (Signed)
[facility] referral placed. ?

## 2021-06-12 ENCOUNTER — Encounter (INDEPENDENT_AMBULATORY_CARE_PROVIDER_SITE_OTHER): Payer: Self-pay | Admitting: Gastroenterology

## 2021-06-12 ENCOUNTER — Ambulatory Visit (INDEPENDENT_AMBULATORY_CARE_PROVIDER_SITE_OTHER): Payer: Medicaid Other | Admitting: Gastroenterology

## 2021-12-29 ENCOUNTER — Encounter (INDEPENDENT_AMBULATORY_CARE_PROVIDER_SITE_OTHER): Payer: Self-pay | Admitting: Gastroenterology

## 2022-04-16 ENCOUNTER — Encounter: Payer: Self-pay | Admitting: Radiology

## 2022-11-30 ENCOUNTER — Emergency Department (HOSPITAL_COMMUNITY)
Admission: EM | Admit: 2022-11-30 | Discharge: 2022-11-30 | Discharge status: Against Medical Advice | Payer: Medicaid Other | Attending: Emergency Medicine | Admitting: Emergency Medicine

## 2022-11-30 ENCOUNTER — Encounter (HOSPITAL_COMMUNITY): Payer: Self-pay

## 2022-11-30 ENCOUNTER — Other Ambulatory Visit: Payer: Self-pay

## 2022-11-30 DIAGNOSIS — M545 Low back pain, unspecified: Secondary | ICD-10-CM | POA: Insufficient documentation

## 2022-11-30 DIAGNOSIS — Z5321 Procedure and treatment not carried out due to patient leaving prior to being seen by health care provider: Secondary | ICD-10-CM | POA: Insufficient documentation

## 2022-11-30 NOTE — ED Triage Notes (Signed)
Lumbar pain getting worse over the past 5 days. Denies nay pain down legs.   L4-L5 disc protrusion on prior MRI

## 2023-03-23 IMAGING — MR MR LUMBAR SPINE W/O CM
5 series · 31 of 48 positions shown · non-contrast
Comparison: Radiograph from 05/09/2020.

CLINICAL DATA: Initial evaluation for low back pain.

EXAM:
MRI LUMBAR SPINE WITHOUT CONTRAST
TECHNIQUE: Multiplanar, multisequence MR imaging of the lumbar spine was
performed. No intravenous contrast was administered.

[Series 5: T2 · sagittal · 4.0mm · 0.68mm/px · 6 of 15 slices shown (1 of 2)]
[im 1/15]
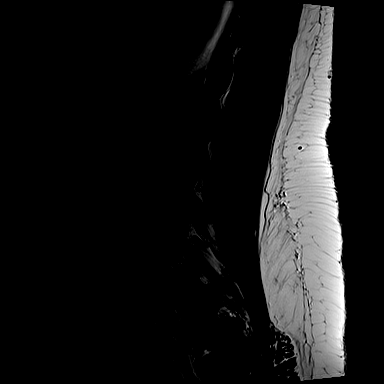
[im 3/15]
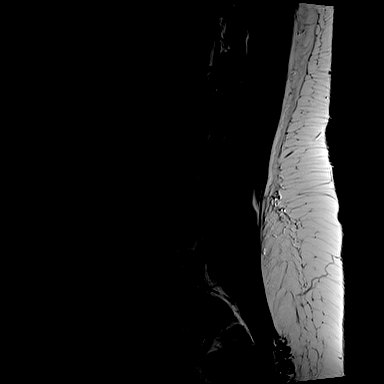
[im 6/15]
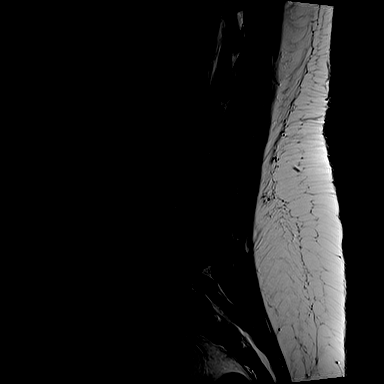
[im 9/15]
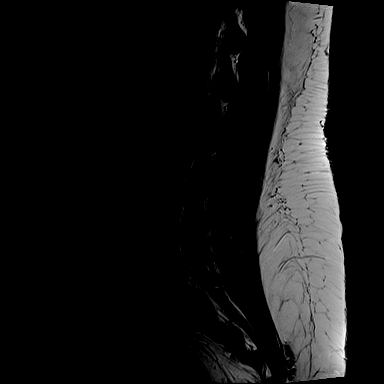
[im 12/15]
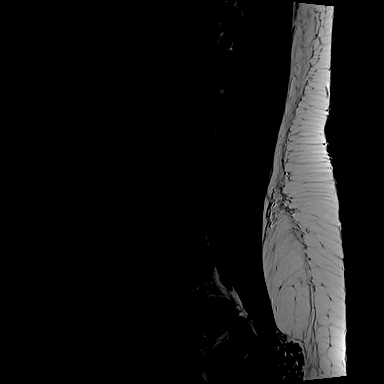
[im 15/15]
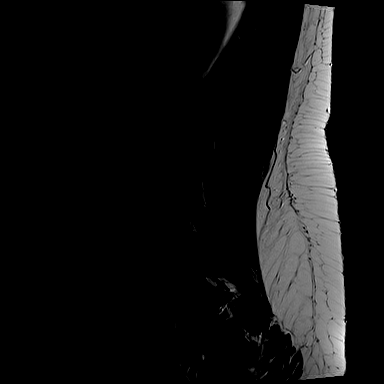

[Series 6: T1 · sagittal · 4.0mm · 0.81mm/px · 7 of 15 slices shown (1 of 2)]
[im 1/15]
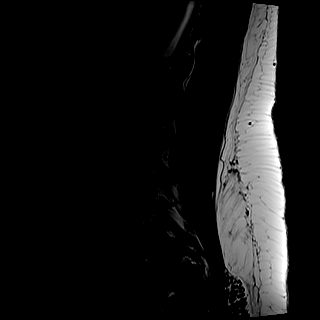
[im 3/15]
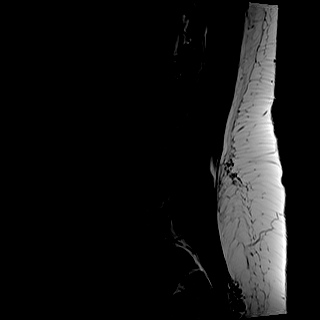
[im 5/15]
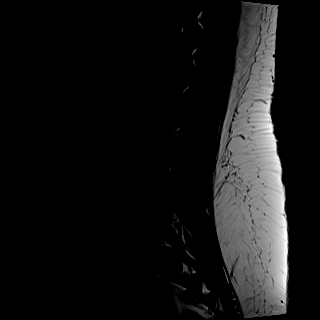
[im 8/15]
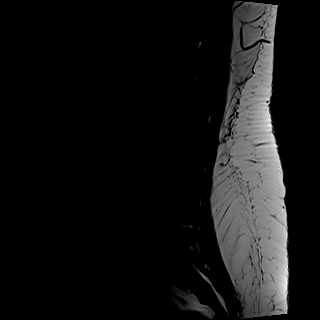
[im 10/15]
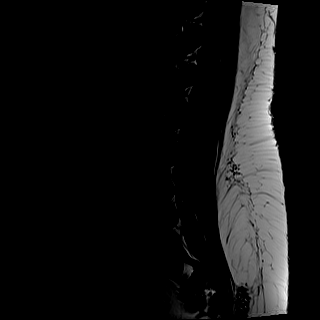
[im 12/15]
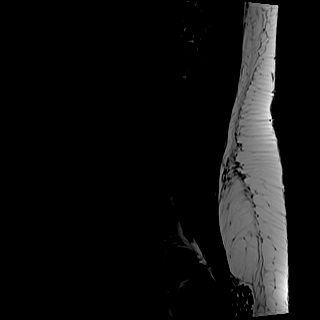
[im 15/15]
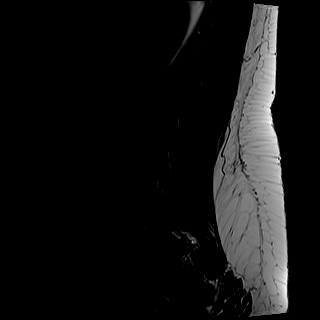

[Series 7: STIR · sagittal · 4.0mm · 0.51mm/px · 2 of 15 slices shown]
[im 1/15]
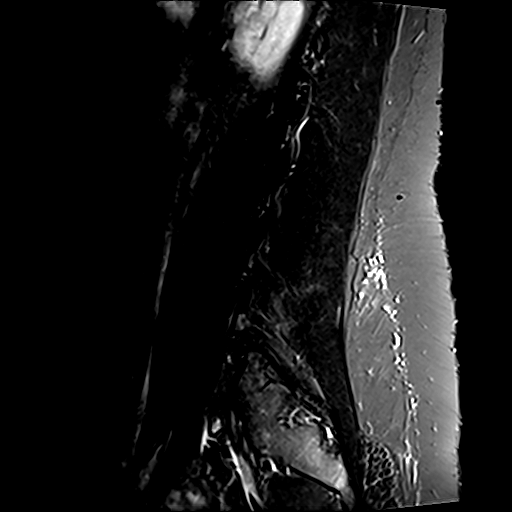
[im 3/15]
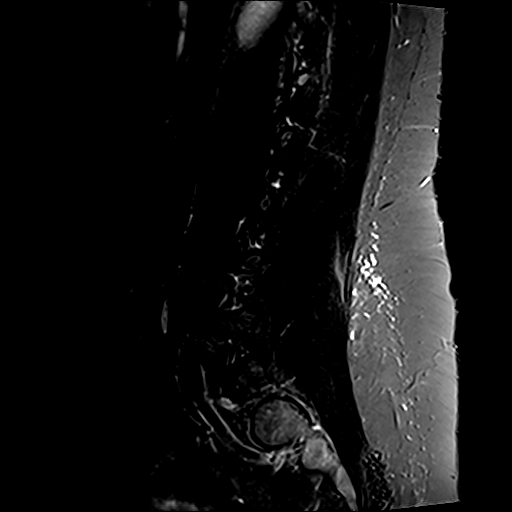

[Series 8: T2 · axial · 4.0mm · 0.70mm/px · z∈[-140,+30]mm · 8 of 31 slices shown (2 of 2)]
[im 1/31]
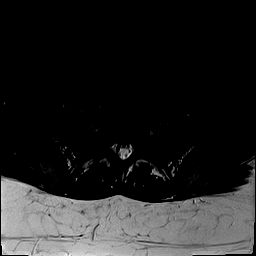
[im 5/31]
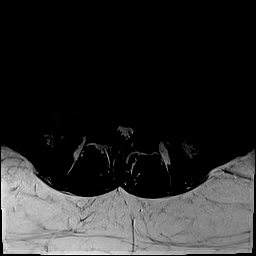
[im 10/31]
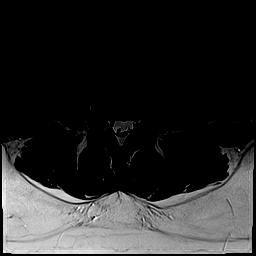
[im 14/31]
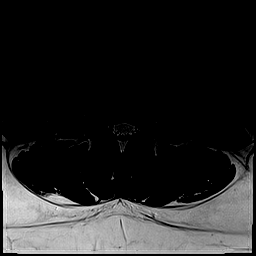
[im 17/31]
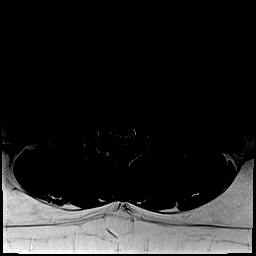
[im 21/31]
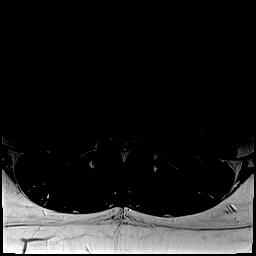
[im 26/31]
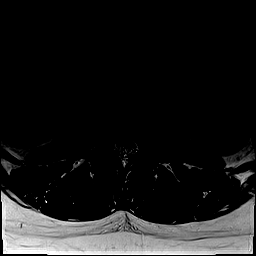
[im 31/31]
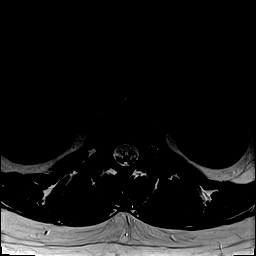

[Series 9: T1 · axial · 4.0mm · 0.35mm/px · z∈[-140,+30]mm · 8 of 31 slices shown (2 of 2)]
[im 1/31]
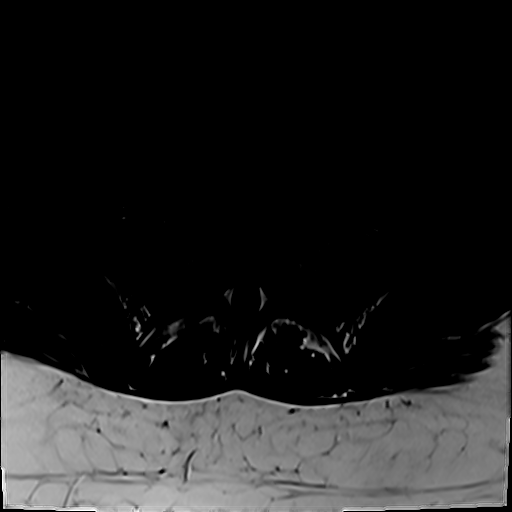
[im 5/31]
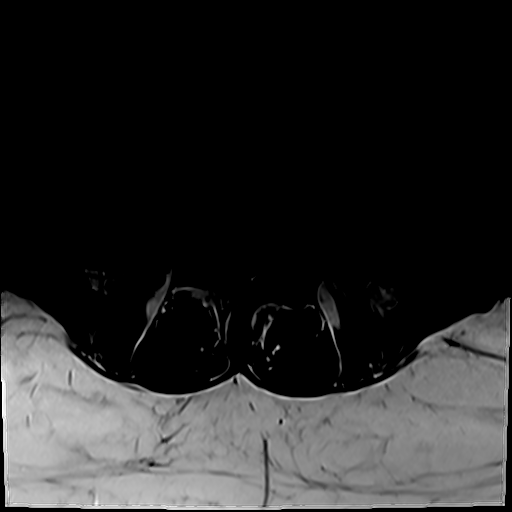
[im 10/31]
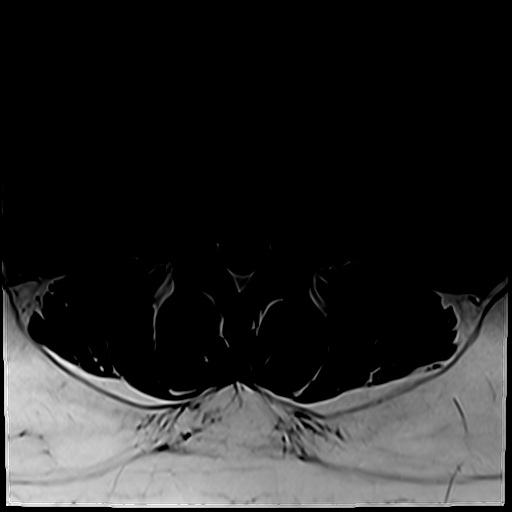
[im 14/31]
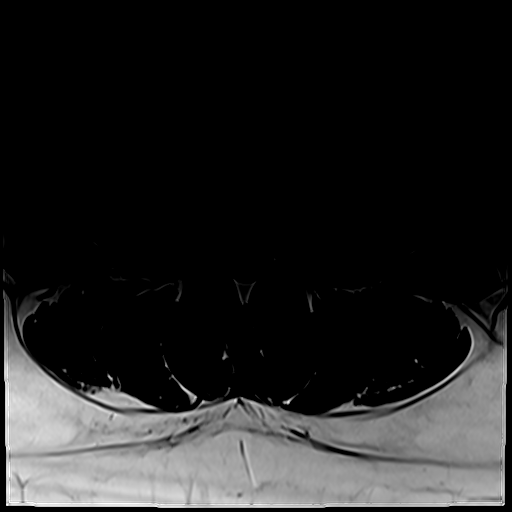
[im 17/31]
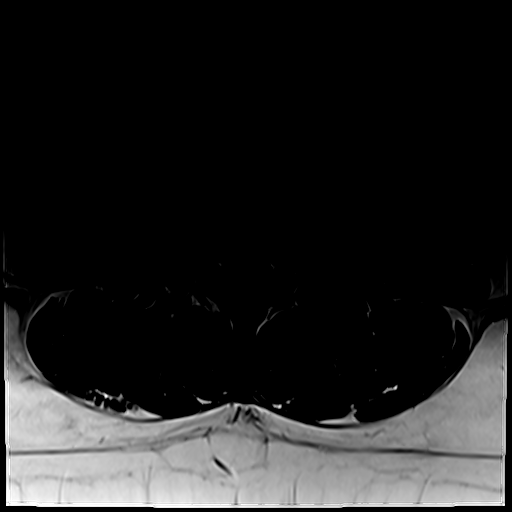
[im 21/31]
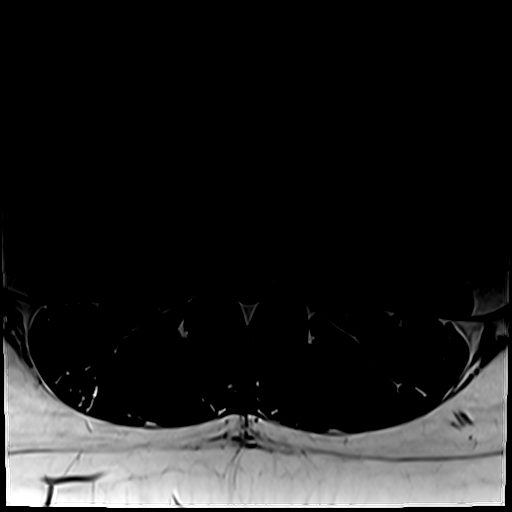
[im 26/31]
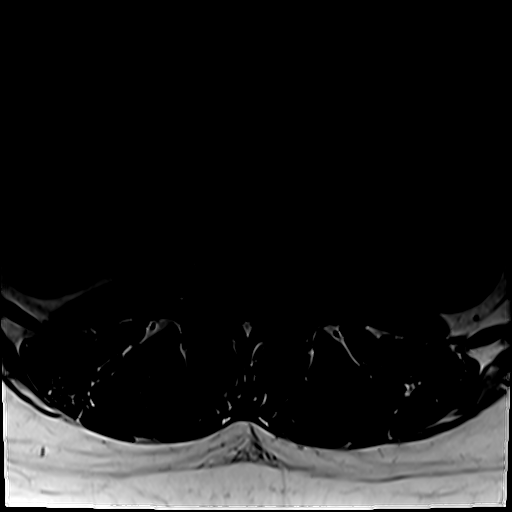
[im 31/31]
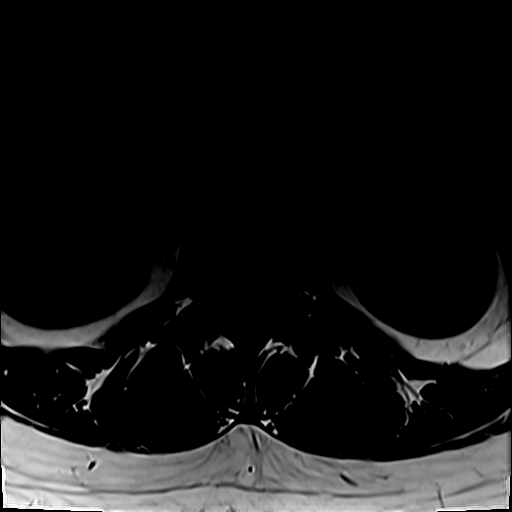

[31 of 48 positions shown; findings below may reference images not displayed]

FINDINGS: Segmentation: Standard. Lowest well-formed disc space labeled the
L5-S1 level.

Alignment: Trace retrolisthesis of L4 on L5 and L5 on S1. Alignment
otherwise normal with preservation of the normal lumbar lordosis.

Vertebrae: Vertebral body height maintained without acute or chronic
fracture. Bone marrow signal intensity within normal limits. No
discrete or worrisome osseous lesions or abnormal marrow edema.

Conus medullaris and cauda equina: Conus extends to the L1 level.
Conus and cauda equina appear normal.

Paraspinal and other soft tissues: Unremarkable.

Disc levels:

L1-2:  Unremarkable.

L2-3:  Unremarkable.

L3-4:  Unremarkable.

L4-5: Disc bulge with disc desiccation. Superimposed small central
disc protrusion minimally indents the ventral thecal sac. No
significant spinal stenosis. Foramina remain patent.

L5-S1: Disc desiccation with disc bulge. Superimposed central to
left subarticular disc protrusion indents the ventral thecal sac
(series 8, image 27). Associated annular fissure. Mild bilateral
facet hypertrophy. Resultant mild left greater than right lateral
recess stenosis. Central canal remains patent. Mild bilateral L5
foraminal stenosis.
IMPRESSION: 1. Central to left subarticular disc protrusion at L5-S1 with
resultant mild left greater than right lateral recess stenosis, with
mild bilateral L5 foraminal narrowing.
2. Small central disc protrusion at L4-5 without stenosis or
impingement.

## 2023-10-08 ENCOUNTER — Encounter: Payer: Self-pay | Admitting: Radiology

## 2023-12-20 ENCOUNTER — Encounter: Payer: Self-pay | Admitting: Radiology
